# Patient Record
Sex: Male | Born: 1980 | Race: White | Hispanic: No | Marital: Married | Smoking: Never smoker
Health system: Southern US, Community
[De-identification: ages and names within clinical notes are randomized; demographics above are authoritative.]

## PROBLEM LIST (undated history)

## (undated) DIAGNOSIS — K589 Irritable bowel syndrome without diarrhea: Secondary | ICD-10-CM

## (undated) DIAGNOSIS — K219 Gastro-esophageal reflux disease without esophagitis: Secondary | ICD-10-CM

## (undated) DIAGNOSIS — K8020 Calculus of gallbladder without cholecystitis without obstruction: Secondary | ICD-10-CM

---

## 1999-06-02 ENCOUNTER — Inpatient Hospital Stay (HOSPITAL_COMMUNITY): Admission: EM | Admit: 1999-06-02 | Discharge: 1999-06-04 | Payer: Self-pay | Admitting: Emergency Medicine

## 1999-06-03 ENCOUNTER — Encounter: Payer: Self-pay | Admitting: Emergency Medicine

## 2000-02-06 ENCOUNTER — Encounter: Admission: RE | Admit: 2000-02-06 | Discharge: 2000-02-06 | Payer: Self-pay | Admitting: Gastroenterology

## 2000-02-06 ENCOUNTER — Encounter: Payer: Self-pay | Admitting: Gastroenterology

## 2000-10-20 ENCOUNTER — Emergency Department (HOSPITAL_COMMUNITY): Admission: EM | Admit: 2000-10-20 | Discharge: 2000-10-20 | Payer: Self-pay | Admitting: Emergency Medicine

## 2001-01-20 ENCOUNTER — Emergency Department (HOSPITAL_COMMUNITY): Admission: EM | Admit: 2001-01-20 | Discharge: 2001-01-21 | Payer: Self-pay

## 2001-01-21 ENCOUNTER — Ambulatory Visit (HOSPITAL_COMMUNITY): Admission: RE | Admit: 2001-01-21 | Discharge: 2001-01-21 | Payer: Self-pay | Admitting: Gastroenterology

## 2002-10-05 ENCOUNTER — Encounter: Payer: Self-pay | Admitting: Orthopedic Surgery

## 2002-10-05 ENCOUNTER — Ambulatory Visit (HOSPITAL_COMMUNITY): Admission: RE | Admit: 2002-10-05 | Discharge: 2002-10-05 | Payer: Self-pay | Admitting: *Deleted

## 2002-10-27 ENCOUNTER — Emergency Department (HOSPITAL_COMMUNITY): Admission: EM | Admit: 2002-10-27 | Discharge: 2002-10-27 | Payer: Self-pay | Admitting: Emergency Medicine

## 2002-10-27 ENCOUNTER — Encounter: Payer: Self-pay | Admitting: Emergency Medicine

## 2003-01-20 ENCOUNTER — Emergency Department (HOSPITAL_COMMUNITY): Admission: AD | Admit: 2003-01-20 | Discharge: 2003-01-20 | Payer: Self-pay | Admitting: Emergency Medicine

## 2003-03-29 ENCOUNTER — Encounter: Payer: Self-pay | Admitting: Gastroenterology

## 2003-03-29 ENCOUNTER — Encounter: Admission: RE | Admit: 2003-03-29 | Discharge: 2003-03-29 | Payer: Self-pay | Admitting: Gastroenterology

## 2003-06-11 ENCOUNTER — Emergency Department (HOSPITAL_COMMUNITY): Admission: EM | Admit: 2003-06-11 | Discharge: 2003-06-12 | Payer: Self-pay | Admitting: Emergency Medicine

## 2004-07-01 ENCOUNTER — Ambulatory Visit: Payer: Self-pay | Admitting: Gastroenterology

## 2004-07-02 ENCOUNTER — Emergency Department: Payer: Self-pay | Admitting: Emergency Medicine

## 2006-01-04 ENCOUNTER — Emergency Department: Payer: Self-pay | Admitting: Emergency Medicine

## 2008-03-16 ENCOUNTER — Emergency Department: Payer: Self-pay | Admitting: Emergency Medicine

## 2016-07-02 ENCOUNTER — Emergency Department: Payer: 59

## 2016-07-02 ENCOUNTER — Emergency Department
Admission: EM | Admit: 2016-07-02 | Discharge: 2016-07-02 | Disposition: A | Discharge status: Home / Self Care | Payer: 59 | Attending: Emergency Medicine | Admitting: Emergency Medicine

## 2016-07-02 DIAGNOSIS — R1031 Right lower quadrant pain: Secondary | ICD-10-CM | POA: Diagnosis present

## 2016-07-02 DIAGNOSIS — K529 Noninfective gastroenteritis and colitis, unspecified: Secondary | ICD-10-CM | POA: Diagnosis not present

## 2016-07-02 DIAGNOSIS — Z79899 Other long term (current) drug therapy: Secondary | ICD-10-CM | POA: Insufficient documentation

## 2016-07-02 HISTORY — DX: Irritable bowel syndrome without diarrhea: K58.9

## 2016-07-02 HISTORY — DX: Calculus of gallbladder without cholecystitis without obstruction: K80.20

## 2016-07-02 HISTORY — DX: Gastro-esophageal reflux disease without esophagitis: K21.9

## 2016-07-02 LAB — CBC
HCT: 48 % (ref 40.0–52.0)
Hemoglobin: 16.4 g/dL (ref 13.0–18.0)
MCH: 29 pg (ref 26.0–34.0)
MCHC: 34.2 g/dL (ref 32.0–36.0)
MCV: 84.8 fL (ref 80.0–100.0)
PLATELETS: 239 10*3/uL (ref 150–440)
RBC: 5.66 MIL/uL (ref 4.40–5.90)
RDW: 13.8 % (ref 11.5–14.5)
WBC: 9.3 10*3/uL (ref 3.8–10.6)

## 2016-07-02 LAB — URINALYSIS, COMPLETE (UACMP) WITH MICROSCOPIC
Bacteria, UA: NONE SEEN
Bilirubin Urine: NEGATIVE
Glucose, UA: NEGATIVE mg/dL
Ketones, ur: NEGATIVE mg/dL
LEUKOCYTES UA: NEGATIVE
Nitrite: NEGATIVE
Protein, ur: NEGATIVE mg/dL
SPECIFIC GRAVITY, URINE: 1.023 (ref 1.005–1.030)
SQUAMOUS EPITHELIAL / LPF: NONE SEEN
pH: 5 (ref 5.0–8.0)

## 2016-07-02 LAB — COMPREHENSIVE METABOLIC PANEL
ALK PHOS: 50 U/L (ref 38–126)
ALT: 27 U/L (ref 17–63)
AST: 24 U/L (ref 15–41)
Albumin: 4.5 g/dL (ref 3.5–5.0)
Anion gap: 7 (ref 5–15)
BILIRUBIN TOTAL: 0.4 mg/dL (ref 0.3–1.2)
BUN: 16 mg/dL (ref 6–20)
CALCIUM: 9.4 mg/dL (ref 8.9–10.3)
CO2: 27 mmol/L (ref 22–32)
CREATININE: 1.05 mg/dL (ref 0.61–1.24)
Chloride: 104 mmol/L (ref 101–111)
GFR calc Af Amer: >60 mL/min (ref >60)
Glucose, Bld: 115 mg/dL — ABNORMAL HIGH (ref 65–99)
Potassium: 4.4 mmol/L (ref 3.5–5.1)
Sodium: 138 mmol/L (ref 135–145)
Total Protein: 7.5 g/dL (ref 6.5–8.1)

## 2016-07-02 LAB — LIPASE, BLOOD: LIPASE: 14 U/L (ref 11–51)

## 2016-07-02 MED ORDER — ONDANSETRON HCL 4 MG/2ML IJ SOLN
INTRAMUSCULAR | Status: AC
  Administered 2016-07-02: 4 mg via INTRAVENOUS
  Filled 2016-07-02: qty 2

## 2016-07-02 MED ORDER — AMOXICILLIN-POT CLAVULANATE 875-125 MG PO TABS
1.0000 | ORAL_TABLET | Freq: Once | ORAL | Status: AC
  Administered 2016-07-02: 1 via ORAL
  Filled 2016-07-02: qty 1

## 2016-07-02 MED ORDER — IOPAMIDOL (ISOVUE-300) INJECTION 61%
30.0000 mL | Freq: Once | INTRAVENOUS | Status: AC
  Administered 2016-07-02: 30 mL via ORAL

## 2016-07-02 MED ORDER — IOPAMIDOL (ISOVUE-300) INJECTION 61%
100.0000 mL | Freq: Once | INTRAVENOUS | Status: AC | PRN
  Administered 2016-07-02: 100 mL via INTRAVENOUS

## 2016-07-02 MED ORDER — OXYCODONE-ACETAMINOPHEN 5-325 MG PO TABS: 1.0000 | ORAL_TABLET | ORAL | 0 refills | Status: AC | PRN

## 2016-07-02 MED ORDER — SODIUM CHLORIDE 0.9 % IV BOLUS (SEPSIS)
1000.0000 mL | Freq: Once | INTRAVENOUS | Status: AC
  Administered 2016-07-02: 1000 mL via INTRAVENOUS

## 2016-07-02 MED ORDER — METRONIDAZOLE 500 MG PO TABS: 500.0000 mg | ORAL_TABLET | Freq: Three times a day (TID) | ORAL | 0 refills | Status: AC

## 2016-07-02 MED ORDER — METRONIDAZOLE 500 MG PO TABS
500.0000 mg | ORAL_TABLET | Freq: Once | ORAL | Status: AC
  Administered 2016-07-02: 500 mg via ORAL
  Filled 2016-07-02: qty 1

## 2016-07-02 MED ORDER — ONDANSETRON HCL 4 MG/2ML IJ SOLN
4.0000 mg | Freq: Once | INTRAMUSCULAR | Status: AC
  Administered 2016-07-02: 4 mg via INTRAVENOUS

## 2016-07-02 MED ORDER — KETOROLAC TROMETHAMINE 30 MG/ML IJ SOLN
30.0000 mg | Freq: Once | INTRAMUSCULAR | Status: AC
  Administered 2016-07-02: 30 mg via INTRAVENOUS
  Filled 2016-07-02: qty 1

## 2016-07-02 MED ORDER — AMOXICILLIN-POT CLAVULANATE 875-125 MG PO TABS: 1.0000 | ORAL_TABLET | Freq: Two times a day (BID) | ORAL | 0 refills | Status: AC

## 2016-07-02 NOTE — ED Notes (Signed)
Dr. Veronese in room to assess patient.  Will continue to monitor.  

## 2016-07-02 NOTE — ED Triage Notes (Signed)
Pt sent [facility] with c/o lower abd pain for the past 3 days.. Denies N/V/D..Marland Kitchen

## 2016-07-02 NOTE — ED Notes (Signed)
Patient transported to CT scan at this time.  Will continue to monitor.   

## 2016-07-02 NOTE — ED Provider Notes (Signed)
[facility] Emergency Department Provider Note  ____________________________________________  Time seen: Approximately 9:16 AM  I have reviewed the triage vital signs and the nursing notes.   HISTORY  Chief Complaint Abdominal Pain   HPI Keith Lee is a 35 y.o. male wit h/o IBS who presents for evaluation of abdominal pain. Patient reports 3 days of suprapubic pressure like non-radiating constant abdominal pain that gets much worse when patient urinates. He reports a sharp pain in his suprapubic region and right lower quadrant every time he urinates. No flank pain, no nausea, no vomiting, no diarrhea, no constipation, no hematuria, no fever or chills. Patient denies ever having similar pain in the past. Patient currently endorses moderate pain. No prior abdominal surgeries, no prior history of kidney stones.  Past Medical History:  Diagnosis Date  . Gall stones   . GERD (gastroesophageal reflux disease)   . IBS (irritable bowel syndrome)     There are no active problems to display for this patient.   No past surgical history on file.  Prior to Admission medications   Medication Sig Start Date End Date Taking? Authorizing Provider  alosetron (LOTRONEX) 1 MG tablet Take 1 mg by mouth every 3 (three) days.   Yes Historical Provider, MD  DiphenhydrAMINE HCl (ZZZQUIL) 50 MG/30ML LIQD Take 1 Dose by mouth daily as needed (sleep).   Yes Historical Provider, MD  Loperamide HCl (IMODIUM PO) Take 1 tablet by mouth daily as needed (consipation).   Yes Historical Provider, MD  Melatonin 10 MG TABS Take 1 tablet by mouth.   Yes Historical Provider, MD  pantoprazole (PROTONIX) 40 MG tablet Take 40 mg by mouth daily.   Yes Historical Provider, MD  TESTOSTERONE IM Inject 1 mL into the muscle See admin instructions. Patient injects 1 mL every 2 weeks.   Yes Historical Provider, MD  tizanidine (ZANAFLEX) 6 MG capsule Take 6 mg by mouth 3 (three) times daily as  needed for muscle spasms.   Yes Historical Provider, MD  amoxicillin-clavulanate (AUGMENTIN) 875-125 MG tablet Take 1 tablet by mouth 2 (two) times daily. 07/02/16 07/12/16  Nita Sicklearolina Zaydrian Batta, MD  metroNIDAZOLE (FLAGYL) 500 MG tablet Take 1 tablet (500 mg total) by mouth 3 (three) times daily. 07/02/16 07/12/16  Nita Sicklearolina Allure Greaser, MD  oxyCODONE-acetaminophen (ROXICET) 5-325 MG tablet Take 1 tablet by mouth every 4 (four) hours as needed for severe pain. 07/02/16   Nita Sicklearolina Thu Baggett, MD    Allergies Patient has no known allergies.  No family history on file.  Social History Social History  Substance Use Topics  . Smoking status: Never Smoker  . Smokeless tobacco: Never Used  . Alcohol use No    Review of Systems  Constitutional: Negative for fever. Eyes: Negative for visual changes. ENT: Negative for sore throat. Neck: No neck pain  Cardiovascular: Negative for chest pain. Respiratory: Negative for shortness of breath. Gastrointestinal: + suprapubic abdominal pain. No vomiting or diarrhea. Genitourinary: + dysuria. Musculoskeletal: Negative for back pain. Skin: Negative for rash. Neurological: Negative for headaches, weakness or numbness. Psych: No SI or HI  ____________________________________________   PHYSICAL EXAM:  VITAL SIGNS: ED Triage Vitals  Enc Vitals Group     BP 07/02/16 0857 (!) 161/109     Pulse Rate 07/02/16 0857 79     Resp 07/02/16 0857 18     Temp 07/02/16 0857 98.2 F (36.8 C)     Temp Source 07/02/16 0857 Oral     SpO2 07/02/16 0857 97 %  Weight 07/02/16 0843 240 lb (108.9 kg)     Height 07/02/16 0843 6' (1.829 m)     Head Circumference --      Peak Flow --      Pain Score 07/02/16 0843 6     Pain Loc --      Pain Edu? --      Excl. in GC? --     Constitutional: Alert and oriented. Well appearing and in no apparent distress. HEENT:      Head: Normocephalic and atraumatic.         Eyes: Conjunctivae are normal. Sclera is non-icteric.  EOMI. PERRL      Mouth/Throat: Mucous membranes are moist.       Neck: Supple with no signs of meningismus. Cardiovascular: Regular rate and rhythm. No murmurs, gallops, or rubs. 2+ symmetrical distal pulses are present in all extremities. No JVD. Respiratory: Normal respiratory effort. Lungs are clear to auscultation bilaterally. No wheezes, crackles, or rhonchi.  Gastrointestinal: Soft, ttp over the suprapubic region and RLQ, non distended with positive bowel sounds. No rebound or guarding. Genitourinary: No CVA tenderness. Bilateral testicles are descended with no tenderness to palpation, bilateral positive cremasteric reflexes are present, no swelling or erythema of the scrotum. No evidence of inguinal hernia. Musculoskeletal: Nontender with normal range of motion in all extremities. No edema, cyanosis, or erythema of extremities. Neurologic: Normal speech and language. Face is symmetric. Moving all extremities. No gross focal neurologic deficits are appreciated. Skin: Skin is warm, dry and intact. No rash noted. Psychiatric: Mood and affect are normal. Speech and behavior are normal.  ____________________________________________   LABS (all labs ordered are listed, but only abnormal results are displayed)  Labs Reviewed  COMPREHENSIVE METABOLIC PANEL - Abnormal; Notable for the following:       Result Value   Glucose, Bld 115 (*)    All other components within normal limits  URINALYSIS, COMPLETE (UACMP) WITH MICROSCOPIC - Abnormal; Notable for the following:    Color, Urine YELLOW (*)    APPearance CLEAR (*)    Hgb urine dipstick MODERATE (*)    All other components within normal limits  URINE CULTURE  LIPASE, BLOOD  CBC   ____________________________________________  EKG  none  ____________________________________________  RADIOLOGY  CT a/p:  Distal sigmoid circumferential mild wall thickening with trace pericolonic stranding within the pelvis. Appearance  remains nonspecific for mild distal sigmoid colitis. Again, difficult to exclude underlying sigmoid mass or trans mural lesion. Recommend follow-up colonoscopy and GI consultation with the patient's acute symptoms resolve.  Normal appendix ____________________________________________   PROCEDURES  Procedure(s) performed: None Procedures Critical Care performed:  None ____________________________________________   INITIAL IMPRESSION / ASSESSMENT AND PLAN / ED COURSE  35 y.o. male wit h/o IBS who presents for evaluation of 3 days of pressure suprapubic abdominal pain worse with urination. Patient has tenderness to palpation in the suprapubic region and mild tenderness on the right lower quadrant, GU exam is within normal limits and no evidence of torsion or hernia. Vital signs are within normal limits. Differential diagnoses including kidney stone versus UTI versus diverticulitis versus appendicitis versus colitis. We'll treat pain with IV Toradol and IV fluids. We'll check CBC, CMP, lipase, urinalysis.  Clinical Course as of Jul 02 1156  Thu Jul 02, 2016  1153 CT concerning for sigmoid colitis. Patients can be discharged home on Flagyl and Augmentin with a referral to see GI for colonoscopy to rule out mass per radiology recommendation. These instructions were discussed  with patient and his wife. Patient is comfortable with the plan.  [CV]    Clinical Course User Index [CV] Nita Sicklearolina Tyjae Issa, MD    Pertinent labs & imaging results that were available during my care of the patient were reviewed by me and considered in my medical decision making (see chart for details).    ____________________________________________   FINAL CLINICAL IMPRESSION(S) / ED DIAGNOSES  Final diagnoses:  Colitis      NEW MEDICATIONS STARTED DURING THIS VISIT:  New Prescriptions   AMOXICILLIN-CLAVULANATE (AUGMENTIN) 875-125 MG TABLET    Take 1 tablet by mouth 2 (two) times daily.   METRONIDAZOLE  (FLAGYL) 500 MG TABLET    Take 1 tablet (500 mg total) by mouth 3 (three) times daily.   OXYCODONE-ACETAMINOPHEN (ROXICET) 5-325 MG TABLET    Take 1 tablet by mouth every 4 (four) hours as needed for severe pain.     Note:  This document was prepared using Dragon voice recognition software and may include unintentional dictation errors.    Nita Sicklearolina Langford Carias, MD 07/02/16 [phone]

## 2016-07-02 NOTE — Discharge Instructions (Signed)
Take the antibiotics fully as prescribed. Take pain medication as needed. You may also take ibuprofen 600 mg every 6 hours. Follow up with your primary care doctor in 3 days. You will need a colonoscopy to rule out a intestinal mass after you finish the antibiotics.

## 2016-07-03 LAB — URINE CULTURE: CULTURE: NO GROWTH

## 2018-01-24 IMAGING — CT CT ABD-PELV W/ CM
2 of 4 series · 15 of 46 positions shown, 17 images · IV contrast (APPLIED)
Comparison: None available

CLINICAL DATA: Suprapubic abdominal pain extending into the right
lower quadrant

EXAM:
CT ABDOMEN AND PELVIS WITH CONTRAST
TECHNIQUE: Multidetector CT imaging of the abdomen and pelvis was performed
using the standard protocol following bolus administration of
intravenous contrast.
CONTRAST:  100mL ONH28H-6DD IOPAMIDOL (ONH28H-6DD) INJECTION 61%

[Series 2: routine abd/pel with · axial · 0.79mm/px · z∈[-1249,-784]mm · 12 of 103 slices shown, 14 images]
[im 5/103  soft-tissue]
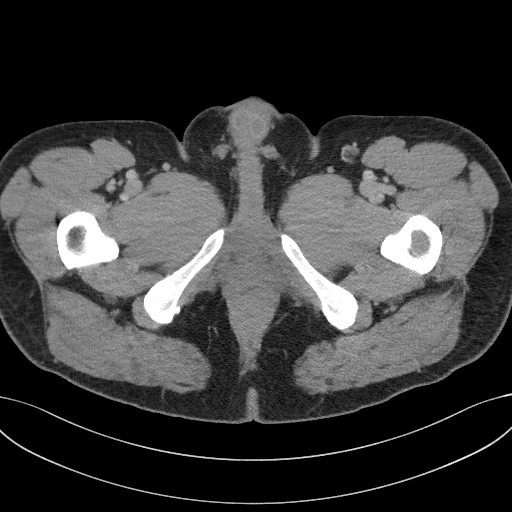
[im 5/103  bone]
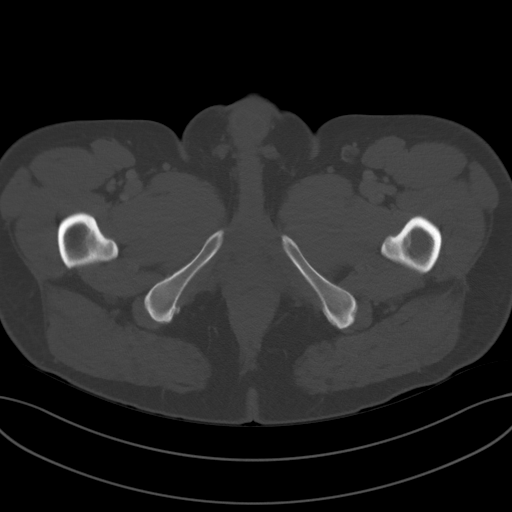
[im 13/103  soft-tissue]
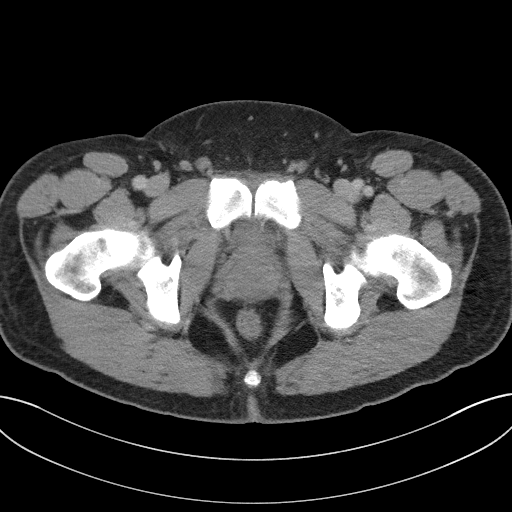
[im 22/103  soft-tissue]
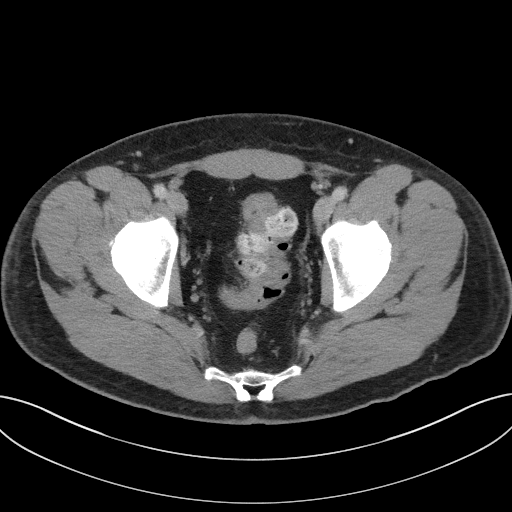
[im 30/103  soft-tissue]
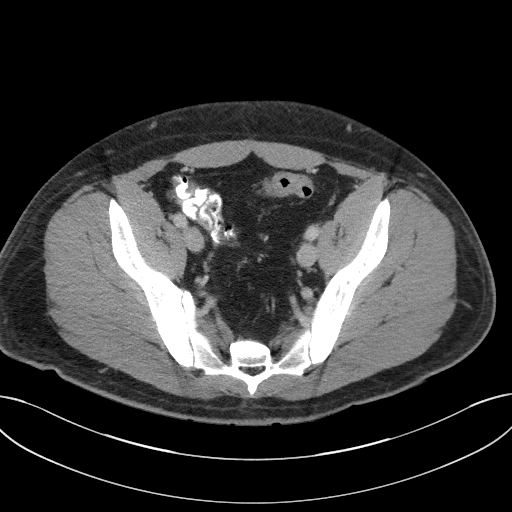
[im 39/103  soft-tissue]
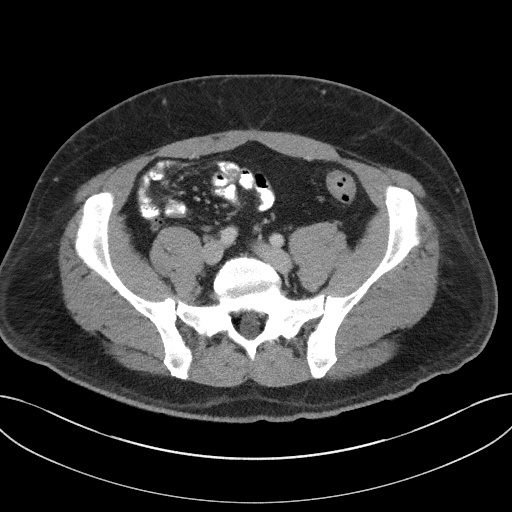
[im 47/103  soft-tissue]
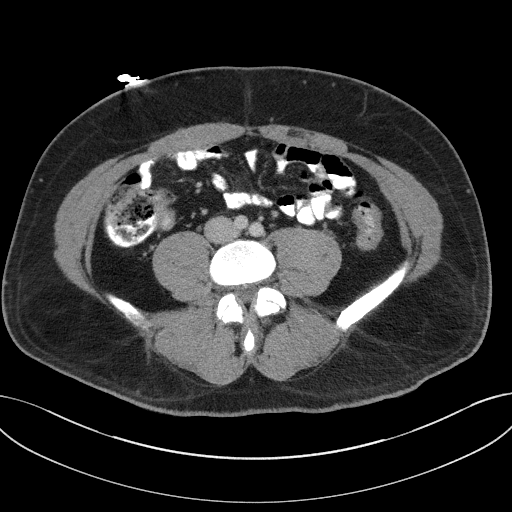
[im 56/103  soft-tissue]
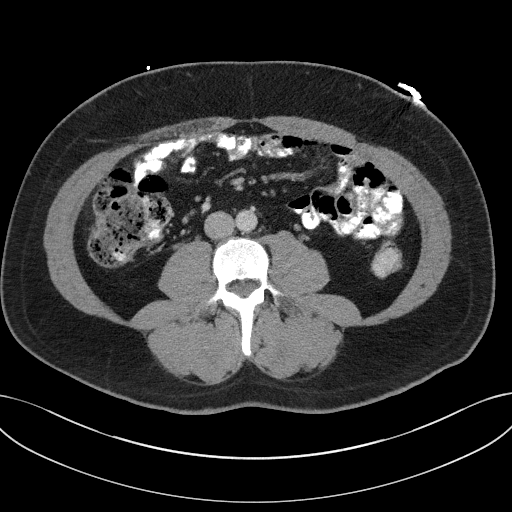
[im 64/103  soft-tissue]
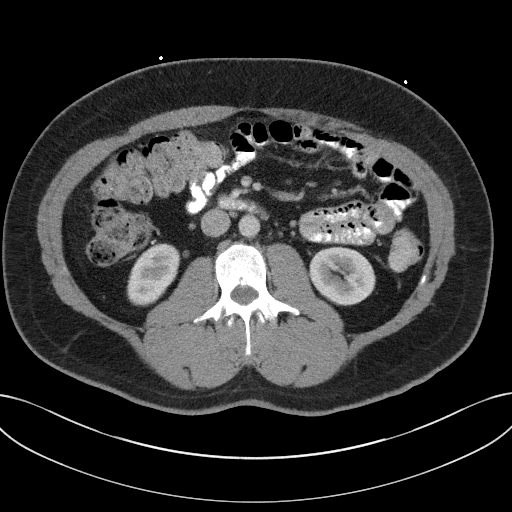
[im 73/103  soft-tissue]
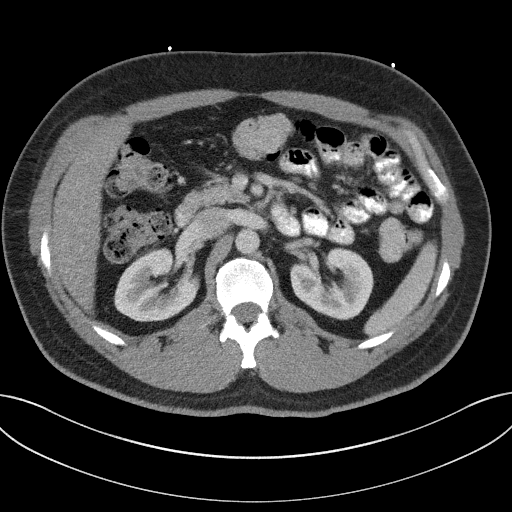
[im 73/103  bone]
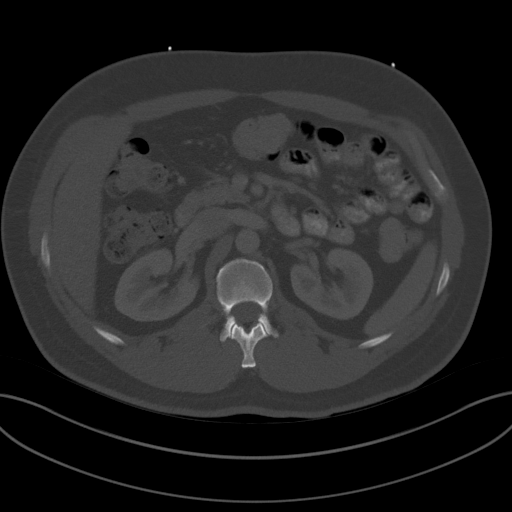
[im 81/103  soft-tissue]
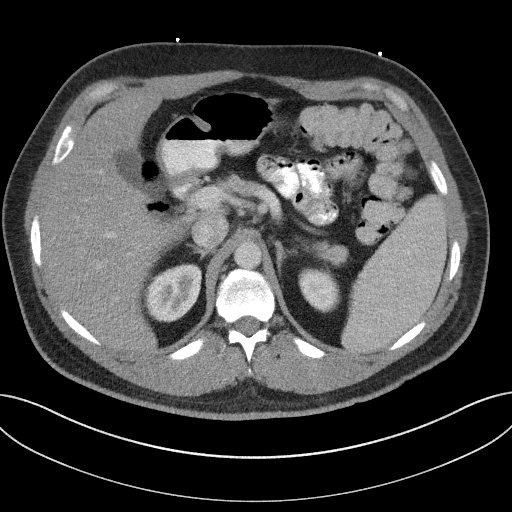
[im 90/103  soft-tissue]
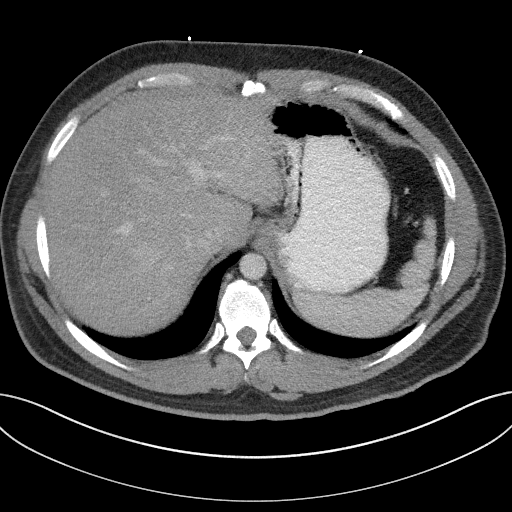
[im 98/103  soft-tissue]
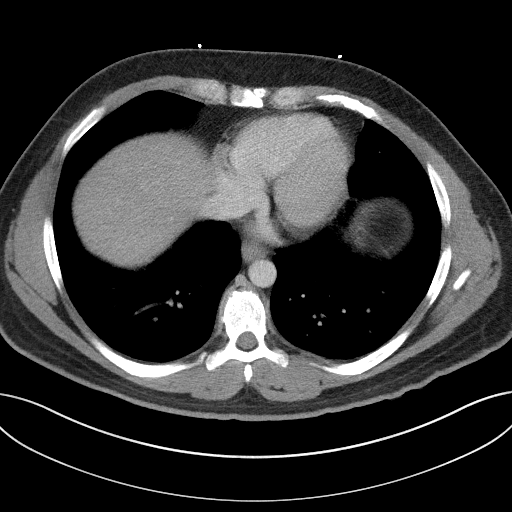

[Series 5: coronal st · coronal · 0.80mm/px · 3 of 99 slices shown]
[im 33/99  soft-tissue]
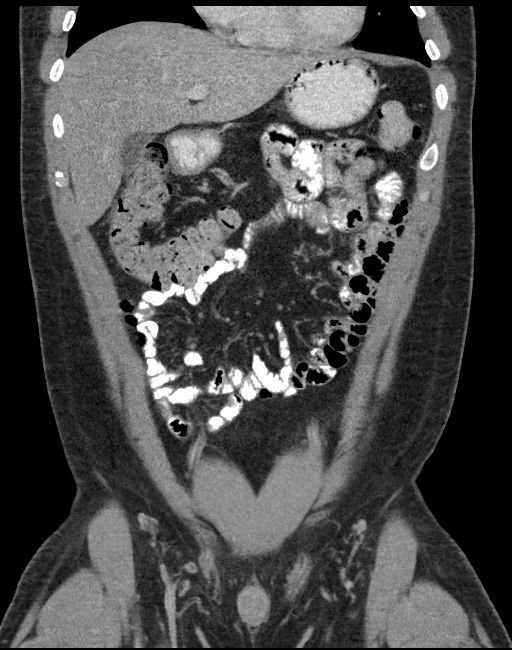
[im 44/99  soft-tissue]
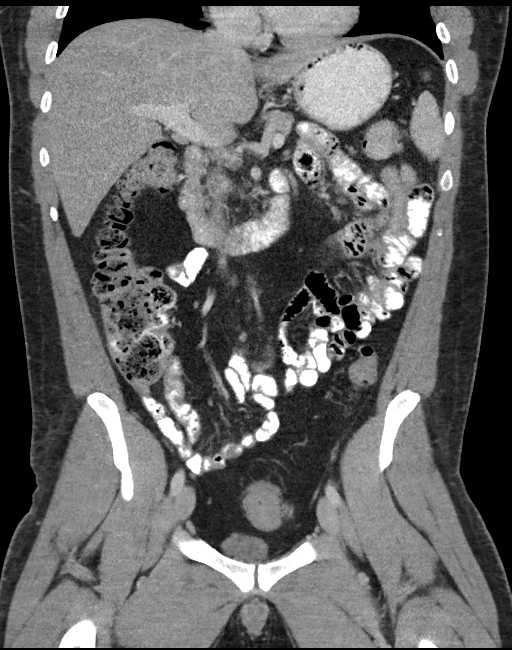
[im 55/99  soft-tissue]
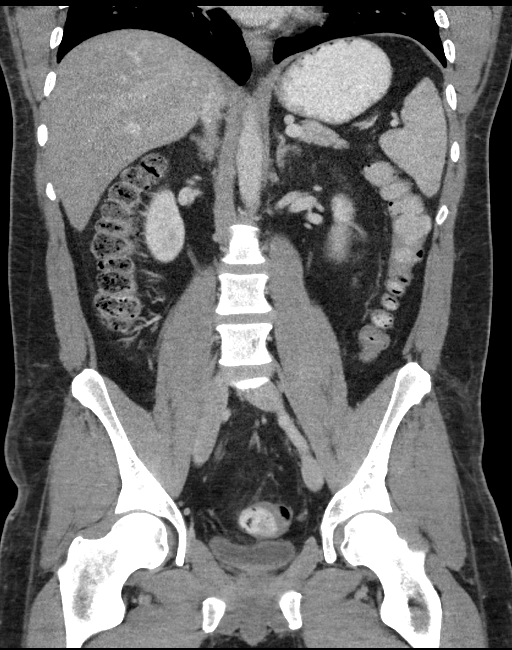

[15 of 46 positions shown; findings below may reference images not displayed]

FINDINGS: Lower chest: No acute abnormality.

Hepatobiliary: No focal liver abnormality is seen. No gallstones,
gallbladder wall thickening, or biliary dilatation.

Pancreas: Unremarkable. No pancreatic ductal dilatation or
surrounding inflammatory changes.

Spleen: Normal in size without focal abnormality. Accessory splenule
noted.

Adrenals/Urinary Tract: Adrenal glands are unremarkable. Kidneys are
normal, without renal calculi, focal lesion, or hydronephrosis.
Bladder is unremarkable.

Stomach/Bowel: Negative for bowel obstruction, significant
dilatation, ileus, or free air. No fluid collection, abscess, or
ascites. Normal appendix visualized.

Within the pelvis, the sigmoid colon demonstrates wall thickening
involving approximately 8-9 cm, images 80 through 86. Minor
pericolonic hazy strandy edema or inflammation overlying the
bladder. No associated diverticular disease. Appearance is
nonspecific and could represent a distal sigmoid colitis. Difficult
to exclude underlying sigmoid mass or trans mural lesion. No
surrounding abnormal adenopathy. Recommend follow-up colonoscopy
when the patient's symptoms resolve.

Vascular/Lymphatic: No significant vascular findings are present. No
enlarged abdominal or pelvic lymph nodes.

Reproductive: Prostate and seminal vesicles unremarkable for age.

Other: No abdominal wall hernia or abnormality. No abdominopelvic
ascites.

Musculoskeletal: No acute or significant osseous findings.
IMPRESSION: Distal sigmoid circumferential mild wall thickening with trace
pericolonic stranding within the pelvis. Appearance remains
nonspecific for mild distal sigmoid colitis. Again, difficult to
exclude underlying sigmoid mass or trans mural lesion. Recommend
follow-up colonoscopy and GI consultation with the patient's acute
symptoms resolve.

Normal appendix

These results will be called to the ordering clinician or
representative by the Radiologist Assistant, and communication
documented in the PACS or zVision Dashboard.
